# Patient Record
Sex: Male | Born: 1985 | Race: Black or African American | Hispanic: No | Marital: Single | Smoking: Never smoker
Health system: Southern US, Community
[De-identification: ages and names within clinical notes are randomized; demographics above are authoritative.]

## PROBLEM LIST (undated history)

## (undated) DIAGNOSIS — I10 Essential (primary) hypertension: Secondary | ICD-10-CM

---

## 2019-10-29 ENCOUNTER — Encounter (HOSPITAL_COMMUNITY): Payer: Self-pay | Admitting: Emergency Medicine

## 2019-10-29 ENCOUNTER — Other Ambulatory Visit: Payer: Self-pay

## 2019-10-29 ENCOUNTER — Emergency Department (HOSPITAL_COMMUNITY)
Admission: EM | Admit: 2019-10-29 | Discharge: 2019-10-29 | Disposition: A | Discharge status: Home / Self Care | Payer: Self-pay | Attending: Emergency Medicine | Admitting: Emergency Medicine

## 2019-10-29 ENCOUNTER — Emergency Department (HOSPITAL_COMMUNITY): Payer: Self-pay

## 2019-10-29 DIAGNOSIS — I10 Essential (primary) hypertension: Secondary | ICD-10-CM | POA: Insufficient documentation

## 2019-10-29 DIAGNOSIS — S0231XA Fracture of orbital floor, right side, initial encounter for closed fracture: Secondary | ICD-10-CM | POA: Insufficient documentation

## 2019-10-29 DIAGNOSIS — Y999 Unspecified external cause status: Secondary | ICD-10-CM | POA: Insufficient documentation

## 2019-10-29 DIAGNOSIS — Y92410 Unspecified street and highway as the place of occurrence of the external cause: Secondary | ICD-10-CM | POA: Insufficient documentation

## 2019-10-29 DIAGNOSIS — Y939 Activity, unspecified: Secondary | ICD-10-CM | POA: Insufficient documentation

## 2019-10-29 DIAGNOSIS — H5789 Other specified disorders of eye and adnexa: Secondary | ICD-10-CM

## 2019-10-29 HISTORY — DX: Essential (primary) hypertension: I10

## 2019-10-29 MED ORDER — TETRACAINE HCL 0.5 % OP SOLN
2.0000 [drp] | Freq: Once | OPHTHALMIC | Status: AC
  Administered 2019-10-29: 2 [drp] via OPHTHALMIC
  Filled 2019-10-29: qty 4

## 2019-10-29 MED ORDER — AMOXICILLIN-POT CLAVULANATE 875-125 MG PO TABS: 1.0000 | ORAL_TABLET | Freq: Two times a day (BID) | ORAL | 0 refills | Status: AC

## 2019-10-29 MED ORDER — FLUORESCEIN SODIUM 1 MG OP STRP
1.0000 | ORAL_STRIP | Freq: Once | OPHTHALMIC | Status: AC
  Administered 2019-10-29: 1 via OPHTHALMIC
  Filled 2019-10-29: qty 1
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
## 2019-10-29 NOTE — Discharge Instructions (Addendum)
Your CT scan showed an orbital floor fracture (fracture of your eye socket) with a part of your eye muscle stuck inside the fracture itself.  Please pick up antibiotics and take as prescribed. It is very important to finish the entire course of antibiotics.  While at home please apply ice throughout the day to reduce swelling/inflammation You may also take Ibuprofen and Tylenol as needed for pain The most important thing is to avoid sneezing or blowing your nose as this can increase the intraocular pressure and cause worsening entrapment   Follow up with both Dr. Dione Booze Ophthalmologist and Dr. Danice Goltz ENT for further evaluation. Call their offices Monday morning to schedule an appointment to be seen either Monday or Tuesday. They are both located in [place]. IT IS VERY IMPORTANT THAT YOU SCHEDULE THESE APPOINTMENTS.
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
